# Patient Record
Sex: Female | Born: 2000 | Race: Black or African American | Hispanic: No | Marital: Single | Smoking: Never smoker
Health system: Southern US, Community
[De-identification: ages and names within clinical notes are randomized; demographics above are authoritative.]

---
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
## 2023-05-08 ENCOUNTER — Other Ambulatory Visit: Payer: Self-pay | Admitting: Obstetrics and Gynecology

## 2023-05-08 DIAGNOSIS — E049 Nontoxic goiter, unspecified: Secondary | ICD-10-CM

## 2023-05-13 ENCOUNTER — Ambulatory Visit
Admission: RE | Admit: 2023-05-13 | Discharge: 2023-05-13 | Disposition: A | Discharge status: Home / Self Care | Payer: Medicaid Other | Source: Ambulatory Visit | Attending: Obstetrics and Gynecology | Admitting: Obstetrics and Gynecology

## 2023-05-13 DIAGNOSIS — E049 Nontoxic goiter, unspecified: Secondary | ICD-10-CM
